# Patient Record
Sex: Female | Born: 1985 | Race: White | Hispanic: No | Marital: Married | Smoking: Former smoker
Health system: Southern US, Community
[De-identification: ages and names within clinical notes are randomized; demographics above are authoritative.]

---

## 2015-05-12 ENCOUNTER — Emergency Department (HOSPITAL_COMMUNITY)
Admission: EM | Admit: 2015-05-12 | Discharge: 2015-05-12 | Disposition: A | Discharge status: Home / Self Care | Payer: Self-pay | Attending: Emergency Medicine | Admitting: Emergency Medicine

## 2015-05-12 ENCOUNTER — Encounter (HOSPITAL_COMMUNITY): Payer: Self-pay | Admitting: *Deleted

## 2015-05-12 DIAGNOSIS — Y998 Other external cause status: Secondary | ICD-10-CM | POA: Insufficient documentation

## 2015-05-12 DIAGNOSIS — S61011A Laceration without foreign body of right thumb without damage to nail, initial encounter: Secondary | ICD-10-CM | POA: Insufficient documentation

## 2015-05-12 DIAGNOSIS — Y9389 Activity, other specified: Secondary | ICD-10-CM | POA: Insufficient documentation

## 2015-05-12 DIAGNOSIS — Y929 Unspecified place or not applicable: Secondary | ICD-10-CM | POA: Insufficient documentation

## 2015-05-12 DIAGNOSIS — Z23 Encounter for immunization: Secondary | ICD-10-CM | POA: Insufficient documentation

## 2015-05-12 DIAGNOSIS — Y288XXA Contact with other sharp object, undetermined intent, initial encounter: Secondary | ICD-10-CM | POA: Insufficient documentation

## 2015-05-12 DIAGNOSIS — Z720 Tobacco use: Secondary | ICD-10-CM | POA: Insufficient documentation

## 2015-05-12 MED ORDER — OXYCODONE-ACETAMINOPHEN 5-325 MG PO TABS
1.0000 | ORAL_TABLET | Freq: Once | ORAL | Status: AC
  Administered 2015-05-12: 1 via ORAL
  Filled 2015-05-12: qty 1

## 2015-05-12 MED ORDER — LIDOCAINE HCL (PF) 1 % IJ SOLN
5.0000 mL | Freq: Once | INTRAMUSCULAR | Status: AC
  Administered 2015-05-12: 5 mL
  Filled 2015-05-12: qty 5

## 2015-05-12 MED ORDER — TETANUS-DIPHTH-ACELL PERTUSSIS 5-2.5-18.5 LF-MCG/0.5 IM SUSP
0.5000 mL | Freq: Once | INTRAMUSCULAR | Status: AC
  Administered 2015-05-12: 0.5 mL via INTRAMUSCULAR
  Filled 2015-05-12: qty 0.5

## 2015-05-12 MED ORDER — NAPROXEN 500 MG PO TBEC: 500.0000 mg | DELAYED_RELEASE_TABLET | Freq: Two times a day (BID) | ORAL | Status: DC

## 2015-05-12 MED ORDER — BACITRACIN 500 UNIT/GM EX OINT
1.0000 "application " | TOPICAL_OINTMENT | Freq: Two times a day (BID) | CUTANEOUS | Status: DC
  Filled 2015-05-12: qty 0.9

## 2015-05-12 NOTE — ED Notes (Signed)
Pt to ED c./o finger lac to R thumb. Cut thumb on a can of peaches. Bleeding controlled

## 2015-05-12 NOTE — ED Notes (Signed)
NP Hope at the bedside.  

## 2015-05-12 NOTE — ED Provider Notes (Signed)
CSN: [id number]     Arrival date & time 05/12/15  1728 History  This chart [facility]ese, [facility], working with Arby BarretteMarcy Pfeiffer, MD by Mindy SpannerGarrett Cook, ED Scribe. This patient was seen in room TR10C/TR10C and the patient's care was started at 6:02 PM.  Chief Complaint  Patient presents with  . Finger Injury   The history is provided by the patient. No language interpreter was used.   HPI Comments: Mindy SiasHeather Serrano is a 29 y.o. female who presents to the Emergency Department complaining of a laceration sustained 1 hour ago.  Patient reports she was trying to open a can of peaches and when her can opener did not complete the job, she tried to pry the top off with a fork, causing her complaint.  She has applied pressure and bleeding is controlled.  She is right hand dominant.  History reviewed. No pertinent past medical history. Past Surgical History  Procedure Laterality Date  . Cesarean section     History reviewed. No pertinent family history. History  Substance Use Topics  . Smoking status: Current Some Day Smoker  . Smokeless tobacco: Not on file  . Alcohol Use: Yes   OB History    No data available     Review of Systems  Musculoskeletal:       Laceration right thumb.  Skin: Positive for wound.      Allergies  Review of patient's allergies indicates no known allergies.  Home Medications   Prior to Admission medications   Medication Sig Start Date End Date Taking? Authorizing Provider  naproxen (EC-NAPROSYN) 500 MG EC tablet Take 1 tablet (500 mg total) by mouth 2 (two) times daily with a meal. 05/12/15   Hope Orlene OchM Neese, NP   BP 110/72 mmHg  Pulse 81  Temp(Src) 97.8 F (36.6 C) (Oral)  Resp 12  SpO2 100%  LMP 04/24/2015 Physical Exam  Constitutional: She is oriented to person, place, and time. She appears well-developed and well-nourished. No distress.  HENT:  Head: Normocephalic and atraumatic.  Eyes: Conjunctivae and EOM are normal.  Neck: Neck supple. No  tracheal deviation present.  Cardiovascular: Normal rate.   Pulmonary/Chest: Effort normal. No respiratory distress.  Musculoskeletal: Normal range of motion.       Right hand: She exhibits tenderness and laceration. She exhibits normal range of motion, normal capillary refill, no deformity and no swelling. Normal sensation noted. Normal strength noted.       Hands: Radial pulse 2+, good touch sensation. No tendon defect.  Neurological: She is alert and oriented to person, place, and time.  Skin: Skin is warm and dry.  Psychiatric: She has a normal mood and affect. Her behavior is normal.  Nursing note and vitals reviewed.   ED Course  Procedures (including critical care time)  DIAGNOSTIC STUDIES: Oxygen Saturation is 100% on RA, normal by my interpretation.    COORDINATION OF CARE:  6:04 PM Discussed treatment plan with patient at bedside.  Patient acknowledges and agrees with plan.     LACERATION REPAIR PROCEDURE NOTE The patient's identification was confirmed and consent was obtained. This procedure was performed by Janne NapoleonHope M Neese, NP, at 6:46 PM. Site: right thumb Sterile procedures observed Anesthetic used Lidocaine 1% without epinephrine Suture type/size:5-0 prolene Length:1.5 cm # of Sutures: 3 Technique: interrupted Complexitysimple Antibx ointment applied Tetanus UTD or ordered Site anesthetized, irrigated with NS, explored without evidence of foreign body, wound well approximated, site covered with sterile dressing.  Patient tolerated  procedure well without complications. Instructions for care discussed verbally and patient provided with additional written instructions for homecare and f/u.   MDM  29 y.o. female with laceration of the right thumb stable for d/c without neurovascular deficits. Patient will follow up in 7 days for suture removal or sooner for problems. Discussed with the patient and all questioned fully answered.   Final diagnoses:  Laceration of  right thumb, initial encounter    I personally performed the services described in this documentation, which was scribed in my presence. The recorded information has been reviewed and is accurate.    MorrisonHope M Neese, NP 05/12/15 2239  Arby BarretteMarcy Pfeiffer, MD 05/18/15 [phone]

## 2018-11-12 ENCOUNTER — Other Ambulatory Visit: Payer: Self-pay

## 2018-11-12 ENCOUNTER — Emergency Department (HOSPITAL_COMMUNITY)
Admission: EM | Admit: 2018-11-12 | Discharge: 2018-11-12 | Disposition: A | Discharge status: Home / Self Care | Payer: Self-pay | Attending: Emergency Medicine | Admitting: Emergency Medicine

## 2018-11-12 ENCOUNTER — Emergency Department (HOSPITAL_COMMUNITY): Payer: Self-pay

## 2018-11-12 DIAGNOSIS — Y9389 Activity, other specified: Secondary | ICD-10-CM | POA: Insufficient documentation

## 2018-11-12 DIAGNOSIS — S62639A Displaced fracture of distal phalanx of unspecified finger, initial encounter for closed fracture: Secondary | ICD-10-CM

## 2018-11-12 DIAGNOSIS — Y9289 Other specified places as the place of occurrence of the external cause: Secondary | ICD-10-CM | POA: Insufficient documentation

## 2018-11-12 DIAGNOSIS — F17200 Nicotine dependence, unspecified, uncomplicated: Secondary | ICD-10-CM | POA: Insufficient documentation

## 2018-11-12 DIAGNOSIS — Y999 Unspecified external cause status: Secondary | ICD-10-CM | POA: Insufficient documentation

## 2018-11-12 DIAGNOSIS — W230XXA Caught, crushed, jammed, or pinched between moving objects, initial encounter: Secondary | ICD-10-CM | POA: Insufficient documentation

## 2018-11-12 DIAGNOSIS — S62631A Displaced fracture of distal phalanx of left index finger, initial encounter for closed fracture: Secondary | ICD-10-CM | POA: Insufficient documentation

## 2018-11-12 MED ORDER — HYDROCODONE-ACETAMINOPHEN 5-325 MG PO TABS: 1.0000 | ORAL_TABLET | ORAL | 0 refills | Status: DC | PRN

## 2018-11-12 MED ORDER — IBUPROFEN 800 MG PO TABS
800.0000 mg | ORAL_TABLET | Freq: Once | ORAL | Status: AC
  Administered 2018-11-12: 800 mg via ORAL
  Filled 2018-11-12: qty 1

## 2018-11-12 MED ORDER — IBUPROFEN 800 MG PO TABS: 800.0000 mg | ORAL_TABLET | Freq: Three times a day (TID) | ORAL | 0 refills | Status: DC

## 2018-11-12 NOTE — ED Triage Notes (Signed)
Patient c/o pain to left hand, 2nd and 3rd digits after slamming the car door on her fingers.

## 2018-11-12 NOTE — ED Provider Notes (Signed)
[facility] EMERGENCY DEPARTMENT Provider Note   CSN: [id number] Arrival date & time: 11/12/18  0330     History   Chief Complaint Chief Complaint  Patient presents with  . Hand Pain    HPI Mindy Serrano is a 32 y.o. female.  The history is provided by the patient and medical records.  Hand Pain      32 y.o. F here with left index finger pain after she accidentally slammed it in a car door around midnight (about 3.5 hours ago).  States she has throbbing pain in the affected finger and difficulty with ROM.  Reports some tingling but denies numbness.  She is right hand dominant.  No intervention tried PTA.  No past medical history on file.  There are no active problems to display for this patient.   Past Surgical History:  Procedure Laterality Date  . CESAREAN SECTION       OB History   None      Home Medications    Prior to Admission medications   Medication Sig Start Date End Date Taking? Authorizing Provider  naproxen (EC-NAPROSYN) 500 MG EC tablet Take 1 tablet (500 mg total) by mouth 2 (two) times daily with a meal. 05/12/15   Janne Napoleon, NP    Family History No family history on file.  Social History Social History   Tobacco Use  . Smoking status: Current Some Day Smoker  Substance Use Topics  . Alcohol use: Yes  . Drug use: No     Allergies   Patient has no known allergies.   Review of Systems Review of Systems  Musculoskeletal: Positive for arthralgias.  All other systems reviewed and are negative.    Physical Exam Updated Vital Signs BP (!) 171/125 (BP Location: Right Arm)   Pulse 99   Temp 98 F (36.7 C) (Oral)   Resp 18   Ht 5\' 4"  (1.626 m)   Wt 90.7 kg   SpO2 98%   BMI 34.33 kg/m   Physical Exam  Constitutional: She is oriented to person, place, and time. She appears well-developed and well-nourished.  HENT:  Head: Normocephalic and atraumatic.  Mouth/Throat: Oropharynx is clear and moist.    Eyes: Pupils are equal, round, and reactive to light. Conjunctivae and EOM are normal.  Neck: Normal range of motion.  Cardiovascular: Normal rate, regular rhythm and normal heart sounds.  Pulmonary/Chest: Effort normal and breath sounds normal.  Abdominal: Soft. Bowel sounds are normal.  Musculoskeletal: Normal range of motion.       Left hand: She exhibits tenderness and bony tenderness.       Hands: Left index finger with swelling and bruising over the palmar aspect of the IP joint; there is no open wound or laceration; there does seem to be some abrasions around the cuticle and a small area of subungual hematoma; normal distal sensation, normal cap refill  Neurological: She is alert and oriented to person, place, and time.  Skin: Skin is warm and dry.  Psychiatric: She has a normal mood and affect.  Nursing note and vitals reviewed.    ED Treatments / Results  Labs (all labs ordered are listed, but only abnormal results are displayed) Labs Reviewed - No data to display  EKG None  Radiology Dg Finger Index Left  Result Date: 11/12/2018 CLINICAL DATA:  32 y/o  F; crush injury of the distal index finger. EXAM: LEFT INDEX FINGER 2+V COMPARISON:  None. FINDINGS: Acute comminuted minimally displaced  fracture of the second distal phalanx tuft. No additional fracture or dislocation identified. IMPRESSION: Acute comminuted minimally displaced fracture of the second distal phalanx tuft. Electronically Signed   By: Mitzi HansenLance  Furusawa-Stratton M.D.   On: 11/12/2018 04:08    Procedures Procedures (including critical care time)  Medications Ordered in ED Medications - No data to display   Initial Impression / Assessment and Plan / ED Course  I have reviewed the triage vital signs and the nursing notes.  Pertinent labs & imaging results that were available during my care of the patient were reviewed by me and considered in my medical decision making (see chart for details).  32 y.o. F  here with left index finger pain after slamming it in the car door.  Has some bruising to the finger as well as subungual hematoma.  X-ray with comminuted distal tuft fracture.  Finger remains neurovascularly intact.  Placed in static splint, given hand surgery follow-up.  She can return here for any new/acute changes.  Final Clinical Impressions(s) / ED Diagnoses   Final diagnoses:  Closed fracture of tuft of distal phalanx of finger    ED Discharge Orders         Ordered    HYDROcodone-acetaminophen (NORCO/VICODIN) 5-325 MG tablet  Every 4 hours PRN     11/12/18 0431    ibuprofen (ADVIL,MOTRIN) 800 MG tablet  3 times daily     11/12/18 0431           Garlon HatchetSanders,  M, PA-C 11/12/18 0434    Gilda CreasePollina, Christopher J, MD 11/12/18 [phone]

## 2018-11-12 NOTE — Discharge Instructions (Addendum)
Take the prescribed medication as directed. Follow-up with Dr. Amanda PeaGramig-- call for appt. Return to the ED for new or worsening symptoms.

## 2019-07-23 IMAGING — DX DG FINGER INDEX 2+V*L*
1 series · 3 of 3 positions shown · non-contrast
Comparison: None.

CLINICAL DATA: 32 y/o  F; crush injury of the distal index finger.

EXAM:
LEFT INDEX FINGER 2+V

[Series 1: finger · 0.14mm/px · 3 of 3 slices shown]
[im 1/3]
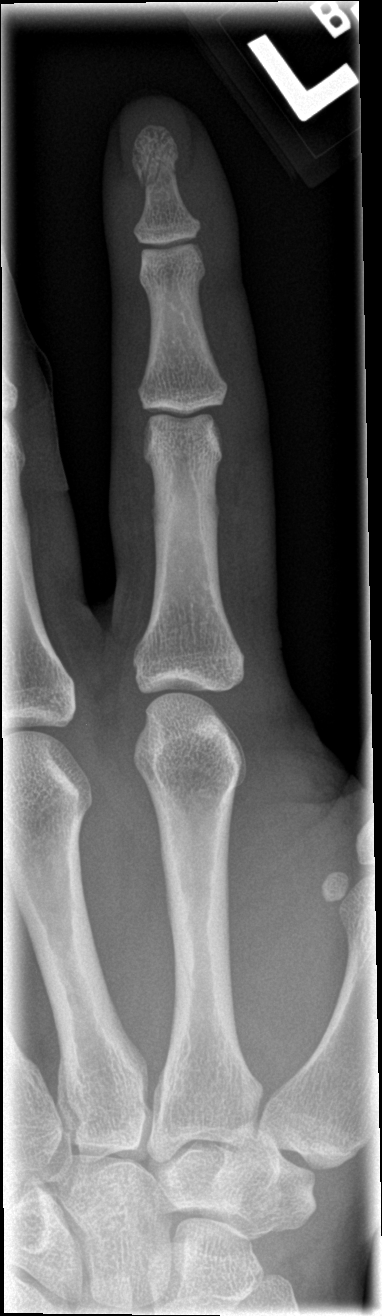
[im 2/3]
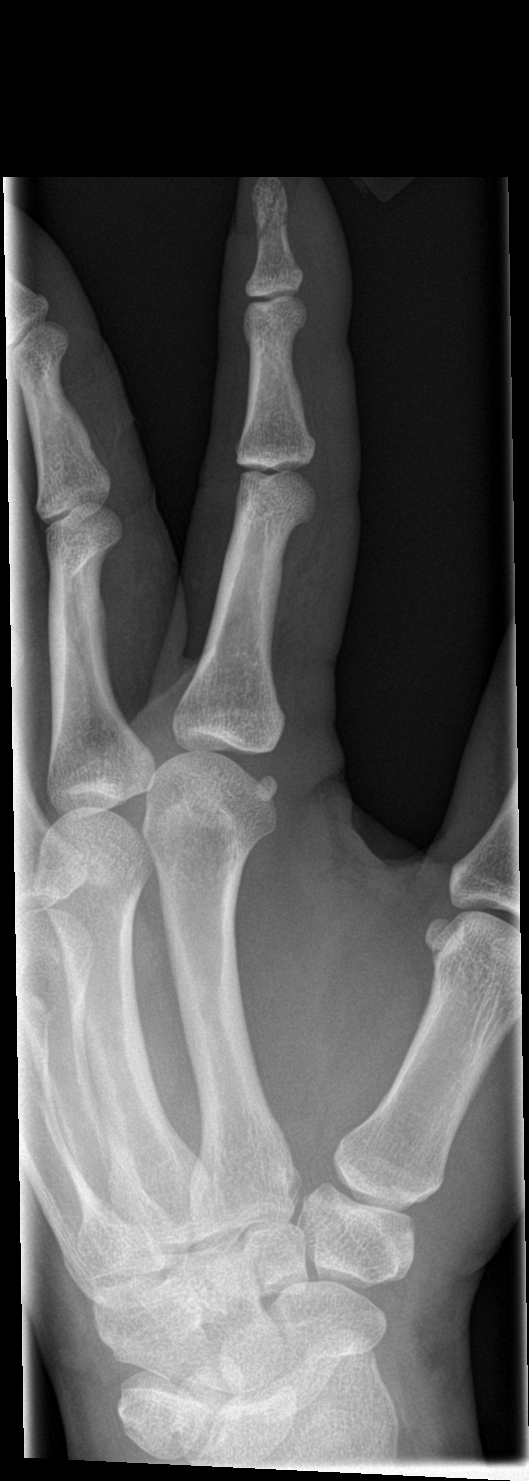
[im 3/3]
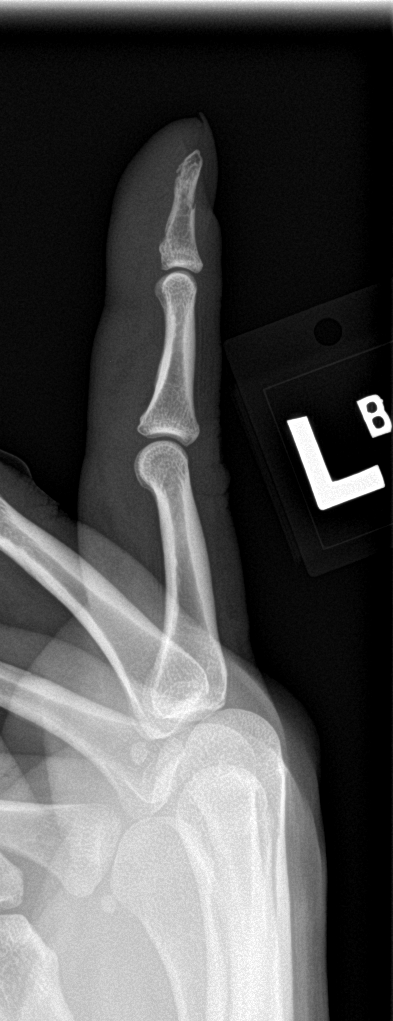

[3 of 3 positions shown; findings below may reference images not displayed]

FINDINGS: Acute comminuted minimally displaced fracture of the second distal
phalanx tuft. No additional fracture or dislocation identified.
IMPRESSION: Acute comminuted minimally displaced fracture of the second distal
phalanx tuft.

## 2021-04-09 ENCOUNTER — Other Ambulatory Visit: Payer: Self-pay

## 2021-04-09 ENCOUNTER — Encounter (HOSPITAL_COMMUNITY): Payer: Self-pay | Admitting: Emergency Medicine

## 2021-04-09 ENCOUNTER — Ambulatory Visit (HOSPITAL_COMMUNITY)
Admission: EM | Admit: 2021-04-09 | Discharge: 2021-04-09 | Disposition: A | Discharge status: Home / Self Care | Payer: Self-pay | Attending: Urgent Care | Admitting: Urgent Care

## 2021-04-09 DIAGNOSIS — J3489 Other specified disorders of nose and nasal sinuses: Secondary | ICD-10-CM | POA: Insufficient documentation

## 2021-04-09 DIAGNOSIS — J069 Acute upper respiratory infection, unspecified: Secondary | ICD-10-CM

## 2021-04-09 DIAGNOSIS — J029 Acute pharyngitis, unspecified: Secondary | ICD-10-CM

## 2021-04-09 DIAGNOSIS — F17200 Nicotine dependence, unspecified, uncomplicated: Secondary | ICD-10-CM | POA: Insufficient documentation

## 2021-04-09 DIAGNOSIS — R0602 Shortness of breath: Secondary | ICD-10-CM | POA: Insufficient documentation

## 2021-04-09 LAB — POCT RAPID STREP A, ED / UC: Streptococcus, Group A Screen (Direct): NEGATIVE

## 2021-04-09 MED ORDER — CETIRIZINE HCL 10 MG PO TABS: 10.0000 mg | ORAL_TABLET | Freq: Every day | ORAL | 0 refills | Status: DC

## 2021-04-09 MED ORDER — PSEUDOEPHEDRINE HCL 60 MG PO TABS: 60.0000 mg | ORAL_TABLET | Freq: Three times a day (TID) | ORAL | 0 refills | Status: DC | PRN

## 2021-04-09 MED ORDER — NAPROXEN 500 MG PO TABS: 500.0000 mg | ORAL_TABLET | Freq: Two times a day (BID) | ORAL | 0 refills | Status: DC

## 2021-04-09 MED ORDER — CHLORASEPTIC MAX SORE THROAT 1.5-33 % MT LIQD: 2.0000 | Freq: Two times a day (BID) | OROMUCOSAL | 0 refills | Status: DC | PRN

## 2021-04-09 NOTE — ED Provider Notes (Signed)
[facility]   MRN: [id number] DOB: December 16, 1986  Subjective:   Mindy Serrano is a 35 y.o. female presenting for acute onset this morning of throat pain. Has also had painful swallowing. Tried benadryl with minimal relief. Has also had stuffy and runny nose. Denies chest pain but does have persistent intermittent shob which is mostly unchanged today. Smokes 1/2ppd.   No current facility-administered medications for this encounter.  Current Outpatient Medications:  .  HYDROcodone-acetaminophen (NORCO/VICODIN) 5-325 MG tablet, Take 1 tablet by mouth every 4 (four) hours as needed., Disp: 12 tablet, Rfl: 0 .  ibuprofen (ADVIL,MOTRIN) 800 MG tablet, Take 1 tablet (800 mg total) by mouth 3 (three) times daily., Disp: 21 tablet, Rfl: 0 .  naproxen (EC-NAPROSYN) 500 MG EC tablet, Take 1 tablet (500 mg total) by mouth 2 (two) times daily with a meal., Disp: 15 tablet, Rfl: 0   Allergies  Allergen Reactions  . Mustard Seed Swelling    History reviewed. No pertinent past medical history.   Past Surgical History:  Procedure Laterality Date  . CESAREAN SECTION      No family history on file.  Social History   Tobacco Use  . Smoking status: Current Every Day Smoker    Packs/day: 0.50  . Smokeless tobacco: Never Used  Substance Use Topics  . Alcohol use: Yes  . Drug use: No    ROS   Objective:   Vitals: BP (!) 135/93   Pulse 89   Temp 98.7 F (37.1 C) (Oral)   Resp 16   LMP 04/02/2021   SpO2 98%   Physical Exam Constitutional:      General: She is not in acute distress.    Appearance: Normal appearance. She is well-developed. She is not ill-appearing, toxic-appearing or diaphoretic.  HENT:     Head: Normocephalic and atraumatic.     Right Ear: External ear normal.     Left Ear: External ear normal.     Nose: Nose normal.     Mouth/Throat:     Mouth: Mucous membranes are moist.     Pharynx: Oropharynx is clear. Posterior oropharyngeal  erythema present. No pharyngeal swelling, oropharyngeal exudate or uvula swelling.     Tonsils: No tonsillar exudate or tonsillar abscesses.  Eyes:     General: No scleral icterus.       Right eye: No discharge.        Left eye: No discharge.     Extraocular Movements: Extraocular movements intact.     Conjunctiva/sclera: Conjunctivae normal.     Pupils: Pupils are equal, round, and reactive to light.  Cardiovascular:     Rate and Rhythm: Normal rate.  Pulmonary:     Effort: Pulmonary effort is normal. No respiratory distress.     Breath sounds: No stridor. No wheezing, rhonchi or rales.  Chest:     Chest wall: No tenderness.  Skin:    General: Skin is warm and dry.  Neurological:     General: No focal deficit present.     Mental Status: She is alert and oriented to person, place, and time.  Psychiatric:        Mood and Affect: Mood normal.        Behavior: Behavior normal.        Thought Content: Thought content normal.        Judgment: Judgment normal.    Negative rapid strep confirmed by PA-Emmabelle Fear.   Assessment and Plan :   PDMP not  reviewed this encounter.  1. Viral upper respiratory tract infection   2. Sore throat   3. Stuffy and runny nose   4. Smoker   5. Shortness of breath     Strep culture pending.  Suspect viral URI, viral syndrome, viral pharyngitis; physical exam findings reassuring and vital signs stable for discharge. Advised supportive care, offered symptomatic relief.  Clear pulmonary exam, will defer chest x-ray.  Patient refused COVID-19 test.  Counseled patient on potential for adverse effects with medications prescribed/recommended today, ER and return-to-clinic precautions discussed, patient verbalized understanding.     Wallis Bamberg, PA-C 04/09/21 1344

## 2021-04-09 NOTE — ED Triage Notes (Signed)
Woke up this morning with a sore throat. Back of throat is red. States breathing feels a bit tight, but this has not worsened since wakening.

## 2021-04-11 LAB — CULTURE, GROUP A STREP (THRC)

## 2022-01-25 ENCOUNTER — Ambulatory Visit
Admission: EM | Admit: 2022-01-25 | Discharge: 2022-01-25 | Disposition: A | Discharge status: Home / Self Care | Payer: 59 | Attending: Internal Medicine | Admitting: Internal Medicine

## 2022-01-25 ENCOUNTER — Other Ambulatory Visit: Payer: Self-pay

## 2022-01-25 ENCOUNTER — Ambulatory Visit (INDEPENDENT_AMBULATORY_CARE_PROVIDER_SITE_OTHER): Payer: 59

## 2022-01-25 ENCOUNTER — Encounter: Payer: Self-pay | Admitting: Emergency Medicine

## 2022-01-25 DIAGNOSIS — S92301A Fracture of unspecified metatarsal bone(s), right foot, initial encounter for closed fracture: Secondary | ICD-10-CM | POA: Diagnosis not present

## 2022-01-25 DIAGNOSIS — S92354A Nondisplaced fracture of fifth metatarsal bone, right foot, initial encounter for closed fracture: Secondary | ICD-10-CM

## 2022-01-25 NOTE — ED Triage Notes (Signed)
Broke right foot in April, states shes had problems with it since then. Was trying to take her sock off the same foot last night when she felt a popping sensation to the lateral fifth metatarsal with pain following. States she feels like there's a knot there now. 3/10 pain, mild swelling

## 2022-01-25 NOTE — Discharge Instructions (Signed)
You have an avulsion fracture of your right foot.  A boot has been applied in urgent care.  Crutches have also been supplied.  Please do not bear any weight until otherwise advised by orthopedist.  Follow-up with orthopedist for further evaluation and management in the next few days.  You may apply ice application as well.

## 2022-01-25 NOTE — ED Provider Notes (Signed)
[facility]    CSN: [place]:[id number] Arrival date & time: 01/25/22  1848      History   Chief Complaint Chief Complaint  Patient presents with   Foot Pain    HPI Mindy Serrano is a 36 y.o. female.   Patient presents with right foot pain that started last night.  She reports that she tried to take her sock off of the opposite foot with the right foot when she felt a popping sensation in her right foot on the right lateral side.  Has been having associated pain ever since.  Denies any numbness or tingling.  Pain occurs with bearing weight.  Patient reports that she broke her foot in April 2022 in the same place.  Has been having intermittent pain ever since but this pain is worse.   Foot Pain   History reviewed. No pertinent past medical history.  There are no problems to display for this patient.   Past Surgical History:  Procedure Laterality Date   CESAREAN SECTION      OB History   No obstetric history on file.      Home Medications    Prior to Admission medications   Medication Sig Start Date End Date Taking? Authorizing Provider  cetirizine (ZYRTEC ALLERGY) 10 MG tablet Take 1 tablet (10 mg total) by mouth daily. 04/09/21   Jaynee Eagles, PA-C  HYDROcodone-acetaminophen (NORCO/VICODIN) 5-325 MG tablet Take 1 tablet by mouth every 4 (four) hours as needed. 11/12/18   Larene Pickett, PA-C  ibuprofen (ADVIL,MOTRIN) 800 MG tablet Take 1 tablet (800 mg total) by mouth 3 (three) times daily. 11/12/18   Larene Pickett, PA-C  naproxen (NAPROSYN) 500 MG tablet Take 1 tablet (500 mg total) by mouth 2 (two) times daily with a meal. 04/09/21   Jaynee Eagles, PA-C  Phenol-Glycerin (CHLORASEPTIC MAX SORE THROAT) 1.5-33 % LIQD Use as directed 2 sprays in the mouth or throat 2 (two) times daily as needed. 04/09/21   Jaynee Eagles, PA-C  pseudoephedrine (SUDAFED) 60 MG tablet Take 1 tablet (60 mg total) by mouth every 8 (eight) hours as needed for congestion. 04/09/21    Jaynee Eagles, PA-C    Family History History reviewed. No pertinent family history.  Social History Social History   Tobacco Use   Smoking status: Every Day    Packs/day: 0.50    Types: Cigarettes   Smokeless tobacco: Never  Substance Use Topics   Alcohol use: Yes   Drug use: No     Allergies   Mustard seed   Review of Systems Review of Systems Per HPI  Physical Exam Triage Vital Signs ED Triage Vitals [01/25/22 1906]  Enc Vitals Group     BP (!) 165/97     Pulse Rate 92     Resp 16     Temp 98.2 F (36.8 C)     Temp Source Oral     SpO2 98 %     Weight      Height      Head Circumference      Peak Flow      Pain Score 3     Pain Loc      Pain Edu?      Excl. in [place]?    No data found.  Updated Vital Signs BP (!) 165/97 (BP Location: Right Arm)    Pulse 92    Temp 98.2 F (36.8 C) (Oral)    Resp 16    SpO2 98%  Visual Acuity Right Eye Distance:   Left Eye Distance:   Bilateral Distance:    Right Eye Near:   Left Eye Near:    Bilateral Near:     Physical Exam Constitutional:      General: She is not in acute distress.    Appearance: Normal appearance. She is not toxic-appearing or diaphoretic.  HENT:     Head: Normocephalic and atraumatic.  Eyes:     Extraocular Movements: Extraocular movements intact.     Conjunctiva/sclera: Conjunctivae normal.  Pulmonary:     Effort: Pulmonary effort is normal.  Musculoskeletal:     Comments: Tenderness to palpation with associated mild swelling noted to base of right fifth metatarsal.  No erythema, bruising, lacerations, abrasions noted.  Neurovascular intact.  Patient has full range of motion of toes and ankle.  Neurological:     General: No focal deficit present.     Mental Status: She is alert and oriented to person, place, and time. Mental status is at baseline.  Psychiatric:        Mood and Affect: Mood normal.        Behavior: Behavior normal.        Thought Content: Thought content normal.         Judgment: Judgment normal.     UC Treatments / Results  Labs (all labs ordered are listed, but only abnormal results are displayed) Labs Reviewed - No data to display  EKG   Radiology DG Foot Complete Right  Result Date: 01/25/2022 CLINICAL DATA:  Foot pain EXAM: RIGHT FOOT COMPLETE - 3+ VIEW COMPARISON:  None. FINDINGS: There is a nondisplaced base of fifth metatarsal avulsion fracture. Mild lateral soft tissue swelling. Plantar calcaneal spur. IMPRESSION: Nondisplaced base of fifth metatarsal avulsion fracture. Electronically Signed   By: Caprice Renshaw M.D.   On: 01/25/2022 19:21    Procedures Procedures (including critical care time)  Medications Ordered in UC Medications - No data to display  Initial Impression / Assessment and Plan / UC Course  I have reviewed the triage vital signs and the nursing notes.  Pertinent labs & imaging results that were available during my care of the patient were reviewed by me and considered in my medical decision making (see chart for details).     Right foot x-ray does show avulsion fracture at base of right fifth metatarsal.  Do not have imaging of previous fracture in April 2022 to compare.  This does appear to be a new fracture.  Will immobilize with a cam boot.  Crutches supplied for patient.  Advised patient of nonweightbearing until otherwise advised by orthopedist.  Provided patient with contact information for orthopedist for follow-up in the next few days.  Discussed return precautions.  Discussed supportive care and ice application.  Patient verbalized understanding and was agreeable with plan. Final Clinical Impressions(s) / UC Diagnoses   Final diagnoses:  Closed avulsion fracture of metatarsal bone of right foot, initial encounter     Discharge Instructions      You have an avulsion fracture of your right foot.  A boot has been applied in urgent care.  Crutches have also been supplied.  Please do not bear any weight until  otherwise advised by orthopedist.  Follow-up with orthopedist for further evaluation and management in the next few days.  You may apply ice application as well.     ED Prescriptions   None    PDMP not reviewed this encounter.   [address] [place],  FNP 01/25/22 1941

## 2022-01-29 ENCOUNTER — Other Ambulatory Visit: Payer: Self-pay

## 2022-01-29 ENCOUNTER — Encounter: Payer: Self-pay | Admitting: Physician Assistant

## 2022-01-29 ENCOUNTER — Ambulatory Visit (INDEPENDENT_AMBULATORY_CARE_PROVIDER_SITE_OTHER): Payer: 59 | Admitting: Physician Assistant

## 2022-01-29 DIAGNOSIS — M79672 Pain in left foot: Secondary | ICD-10-CM | POA: Diagnosis not present

## 2022-01-29 NOTE — Progress Notes (Signed)
Office Visit Note   Patient: Mindy Serrano           Date of Birth: [birth year]           MRN: [id number] Visit Date: 01/29/2022              Requested by: No referring provider defined for this encounter. PCP: Patient, No Pcp Per (Inactive)  Chief Complaint  Patient presents with   Right Foot - Fracture      HPI: Patient is a pleasant 36 year old woman with a chief complaint of right lateral foot pain.  She said she broke her foot back in April 2022.  She said she always had some pain in it but it was getting better.  She was simply manipulating her foot to take off her sock on her other foot and she felt a pop in the same area.  After that it became very painful.  She was seen and evaluated in the emergency room is here in follow-up today  Assessment & Plan: Visit Diagnoses: No diagnosis found.  Plan: Avulsion fracture base of the fifth metatarsal nondisplaced with recurrent injury.  Also tenderness over the distal insertion of the peroneal tendon.  We talked about the natural history of this.  I would like for her to go back in her boot if possible.  She will given a prescription for a lateral heel wedge.  She will follow-up in 2 weeks with new x-rays of her foot.  I am hoping she can start physical therapy.  Given this has been going on now for 10 months if she did not have good improvement I would recommend an MRI.  We also discussed vitamin D supplementation  Follow-Up Instructions: No follow-ups on file.   Ortho Exam  Patient is alert, oriented, no adenopathy, well-dressed, normal affect, normal respiratory effort. Examination of her right foot minimal soft tissue swelling no ecchymosis no tenderness across the Lisfranc joint or ankle.  She is not tender along the proximal peroneal tendon but very tender at the insertion on the base of the fifth metatarsal.  Pain is exacerbated with inversion and eversion.  Dorsiflexion and plantarflexion seem to be okay though she does  remark that when she presses down on her gas pedal it is painful no redness no skin breakdown  Imaging: No results found. No images are attached to the encounter.  Labs: Lab Results  Component Value Date   REPTSTATUS 04/11/2021 FINAL 04/09/2021   CULT  04/09/2021    NO GROUP A STREP (S.PYOGENES) ISOLATED Performed at [facility], [address], [place], [place] [address]      No results found for: ALBUMIN, PREALBUMIN, CBC  No results found for: MG No results found for: VD25OH  No results found for: PREALBUMIN No flowsheet data found.   There is no height or weight on file to calculate BMI.  Orders:  No orders of the defined types were placed in this encounter.  No orders of the defined types were placed in this encounter.    Procedures: No procedures performed  Clinical Data: No additional findings.  ROS:  All other systems negative, except as noted in the HPI. Review of Systems  Objective: Vital Signs: There were no vitals taken for this visit.  Specialty Comments:  No specialty comments available.  PMFS History: There are no problems to display for this patient.  History reviewed. No pertinent past medical history.  History reviewed. No pertinent family history.  Past Surgical  History:  Procedure Laterality Date   CESAREAN SECTION     Social History   Occupational History   Not on file  Tobacco Use   Smoking status: Every Day    Packs/day: 0.50    Types: Cigarettes   Smokeless tobacco: Never  Substance and Sexual Activity   Alcohol use: Yes   Drug use: No   Sexual activity: Not on file

## 2022-02-12 ENCOUNTER — Other Ambulatory Visit: Payer: Self-pay

## 2022-02-12 ENCOUNTER — Ambulatory Visit (INDEPENDENT_AMBULATORY_CARE_PROVIDER_SITE_OTHER): Payer: 59 | Admitting: Physician Assistant

## 2022-02-12 ENCOUNTER — Encounter: Payer: Self-pay | Admitting: Physician Assistant

## 2022-02-12 ENCOUNTER — Ambulatory Visit: Payer: Self-pay

## 2022-02-12 DIAGNOSIS — M25571 Pain in right ankle and joints of right foot: Secondary | ICD-10-CM | POA: Diagnosis not present

## 2022-02-12 NOTE — Progress Notes (Signed)
Office Visit Note   Patient: Mindy Serrano           Date of Birth: 08-May-1986           MRN: [id number] Visit Date: 02/12/2022              Requested by: No referring provider defined for this encounter. PCP: Patient, No Pcp Per (Inactive)  Chief Complaint  Patient presents with   Right Foot - Follow-up      HPI: Patient is a pleasant 36 year old woman who is in follow-up today from a recurrent avulsion injury to the base of her fifth metatarsal on the right.  She said this happened most recently when she was pulling her sock off.  She originally injured this 10 months ago.  When she was trying to take her sock off with her foot she heard a loud pop and the pain returned.  X-rays demonstrated possible avulsion fracture base of fifth metatarsal.  She is not wearing her boot today says she feels better and would like to go back to work  Assessment & Plan: Visit Diagnoses:  1. 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  Talked about the natural history of this.  She wants to go back to work.  I told her at any time if she wanted given how long this has been going on we could go forward with an MRI.  She does have good eversion strength today.  She is still tender over the base of the fifth metatarsal  Follow-Up Instructions: No follow-ups on file.   Ortho Exam  Patient is alert, oriented, no adenopathy, well-dressed, normal affect, normal respiratory effort. Examination demonstrates no redness no swelling.  She does have tenderness to palpation over the base of the fifth metatarsal.  She does not have much pain with resisted eversion inversion and plantarflexion distal CMS is intact  Imaging: No results found. No images are attached to the encounter.  Labs: Lab Results  Component Value Date   REPTSTATUS 04/11/2021 FINAL 04/09/2021   CULT  04/09/2021    NO GROUP A STREP (S.PYOGENES) ISOLATED Performed at  [facility], [address] [place], [place] [address]      No results found for: ALBUMIN, PREALBUMIN, CBC  No results found for: MG No results found for: VD25OH  No results found for: PREALBUMIN No flowsheet data found.   There is no height or weight on file to calculate BMI.  Orders:  Orders Placed This Encounter  Procedures   XR Foot Complete Right   No orders of the defined types were placed in this encounter.    Procedures: No procedures performed  Clinical Data: No additional findings.  ROS:  All other systems negative, except as noted in the HPI. Review of Systems  Objective: Vital Signs: There were no vitals taken for this visit.  Specialty Comments:  No specialty comments available.  PMFS History: There are no problems to display for this patient.  History reviewed. No pertinent past medical history.  History reviewed. No pertinent family history.  Past Surgical History:  Procedure Laterality Date   CESAREAN SECTION     Social History   Occupational History   Not on file  Tobacco Use   Smoking status: Every Day    Packs/day: 0.50    Types: Cigarettes   Smokeless tobacco: Never  Substance and Sexual Activity   Alcohol use:  Yes   Drug use: No   Sexual activity: Not on file

## 2022-02-17 ENCOUNTER — Encounter: Payer: Self-pay | Admitting: Emergency Medicine

## 2022-02-17 ENCOUNTER — Ambulatory Visit
Admission: EM | Admit: 2022-02-17 | Discharge: 2022-02-17 | Disposition: A | Discharge status: Home / Self Care | Payer: 59 | Attending: Internal Medicine | Admitting: Internal Medicine

## 2022-02-17 DIAGNOSIS — J020 Streptococcal pharyngitis: Secondary | ICD-10-CM

## 2022-02-17 LAB — POCT RAPID STREP A (OFFICE): Rapid Strep A Screen: POSITIVE — AB

## 2022-02-17 MED ORDER — AMOXICILLIN-POT CLAVULANATE 875-125 MG PO TABS: 1.0000 | ORAL_TABLET | Freq: Two times a day (BID) | ORAL | 0 refills | Status: DC

## 2022-02-17 NOTE — Discharge Instructions (Signed)
You have strep throat which is being treated with an antibiotic.  Please follow-up if symptoms persist or worsen. ?

## 2022-02-17 NOTE — ED Provider Notes (Signed)
[facility]    CSN: [id number] Arrival date & time: 02/17/22  1433      History   Chief Complaint Chief Complaint  Patient presents with   Sore Throat    HPI Mindy Serrano is a 36 y.o. female.   Patient presents with sore throat and mild nonproductive cough that has been present for 1 day.  Denies any associated upper respiratory symptoms or fever.  Denies any known sick contacts.  Patient has not taken any medications to alleviate symptoms.  Denies chest pain, shortness of breath, ear pain, nausea, vomiting, diarrhea, abdominal pain.   Sore Throat   History reviewed. No pertinent past medical history.  There are no problems to display for this patient.   Past Surgical History:  Procedure Laterality Date   CESAREAN SECTION      OB History   No obstetric history on file.      Home Medications    Prior to Admission medications   Medication Sig Start Date End Date Taking? Authorizing Provider  amoxicillin-clavulanate (AUGMENTIN) 875-125 MG tablet Take 1 tablet by mouth every 12 (twelve) hours. 02/17/22  Yes Khamil Lamica, Acie Fredrickson, FNP  cetirizine (ZYRTEC ALLERGY) 10 MG tablet Take 1 tablet (10 mg total) by mouth daily. 04/09/21   Wallis Bamberg, PA-C  HYDROcodone-acetaminophen (NORCO/VICODIN) 5-325 MG tablet Take 1 tablet by mouth every 4 (four) hours as needed. 11/12/18   Garlon Hatchet, PA-C  ibuprofen (ADVIL,MOTRIN) 800 MG tablet Take 1 tablet (800 mg total) by mouth 3 (three) times daily. 11/12/18   Garlon Hatchet, PA-C  naproxen (NAPROSYN) 500 MG tablet Take 1 tablet (500 mg total) by mouth 2 (two) times daily with a meal. 04/09/21   Wallis Bamberg, PA-C  Phenol-Glycerin (CHLORASEPTIC MAX SORE THROAT) 1.5-33 % LIQD Use as directed 2 sprays in the mouth or throat 2 (two) times daily as needed. 04/09/21   Wallis Bamberg, PA-C  pseudoephedrine (SUDAFED) 60 MG tablet Take 1 tablet (60 mg total) by mouth every 8 (eight) hours as needed for congestion. 04/09/21   Wallis Bamberg,  PA-C    Family History Family History  Problem Relation Age of Onset   Healthy Mother     Social History Social History   Tobacco Use   Smoking status: Every Day    Packs/day: 0.50    Types: Cigarettes   Smokeless tobacco: Never  Substance Use Topics   Alcohol use: Yes   Drug use: No     Allergies   Mustard seed   Review of Systems Review of Systems Per HPI  Physical Exam Triage Vital Signs ED Triage Vitals [02/17/22 1451]  Enc Vitals Group     BP (!) 149/111     Pulse Rate 90     Resp 18     Temp 98.4 F (36.9 C)     Temp Source Oral     SpO2 94 %     Weight 200 lb (90.7 kg)     Height 5\' 4"  (1.626 m)     Head Circumference      Peak Flow      Pain Score 6     Pain Loc      Pain Edu?      Excl. in GC?    No data found.  Updated Vital Signs BP (!) 149/111 (BP Location: Left Arm)    Pulse 90    Temp 98.4 F (36.9 C) (Oral)    Resp 18    Ht 5'  4" (1.626 m)    Wt 200 lb (90.7 kg)    LMP 01/24/2022    SpO2 94%    BMI 34.33 kg/m   Visual Acuity Right Eye Distance:   Left Eye Distance:   Bilateral Distance:    Right Eye Near:   Left Eye Near:    Bilateral Near:     Physical Exam Constitutional:      General: She is not in acute distress.    Appearance: Normal appearance. She is not toxic-appearing or diaphoretic.  HENT:     Head: Normocephalic and atraumatic.     Right Ear: Tympanic membrane and ear canal normal.     Left Ear: Tympanic membrane and ear canal normal.     Nose: Congestion present.     Mouth/Throat:     Mouth: Mucous membranes are moist.     Pharynx: Posterior oropharyngeal erythema present. No oropharyngeal exudate.     Tonsils: No tonsillar exudate or tonsillar abscesses. 1+ on the right. 1+ on the left.  Eyes:     Extraocular Movements: Extraocular movements intact.     Conjunctiva/sclera: Conjunctivae normal.     Pupils: Pupils are equal, round, and reactive to light.  Cardiovascular:     Rate and Rhythm: Normal rate and  regular rhythm.     Pulses: Normal pulses.     Heart sounds: Normal heart sounds.  Pulmonary:     Effort: Pulmonary effort is normal. No respiratory distress.     Breath sounds: Normal breath sounds. No wheezing.  Abdominal:     General: Abdomen is flat. Bowel sounds are normal.     Palpations: Abdomen is soft.  Musculoskeletal:        General: Normal range of motion.     Cervical back: Normal range of motion.  Skin:    General: Skin is warm and dry.  Neurological:     General: No focal deficit present.     Mental Status: She is alert and oriented to person, place, and time. Mental status is at baseline.  Psychiatric:        Mood and Affect: Mood normal.        Behavior: Behavior normal.     UC Treatments / Results  Labs (all labs ordered are listed, but only abnormal results are displayed) Labs Reviewed  POCT RAPID STREP A (OFFICE) - Abnormal; Notable for the following components:      Result Value   Rapid Strep A Screen Positive (*)    All other components within normal limits    EKG   Radiology No results found.  Procedures Procedures (including critical care time)  Medications Ordered in UC Medications - No data to display  Initial Impression / Assessment and Plan / UC Course  I have reviewed the triage vital signs and the nursing notes.  Pertinent labs & imaging results that were available during my care of the patient were reviewed by me and considered in my medical decision making (see chart for details).     Rapid strep test was positive.  Will treat with Augmentin antibiotic given that patient has had recent antibiotic therapy for skin infection.  No signs of peritonsillar abscess on exam.  Discussed supportive care and symptom management with patient.  Discussed return precautions.  Patient verbalized understanding and was agreeable with plan. Final Clinical Impressions(s) / UC Diagnoses   Final diagnoses:  Strep pharyngitis     Discharge  Instructions      You have strep  throat which is being treated with an antibiotic.  Please follow-up if symptoms persist or worsen.    ED Prescriptions     Medication Sig Dispense Auth. Provider   amoxicillin-clavulanate (AUGMENTIN) 875-125 MG tablet Take 1 tablet by mouth every 12 (twelve) hours. 14 tablet [place], Acie Fredrickson, [place]      PDMP not reviewed this encounter.   Gustavus Bryant, [place] 02/17/22 [id number]

## 2022-02-17 NOTE — ED Triage Notes (Signed)
Patient c/o severe sore throat x 1 day, slight cough.  Patient denies any OTC meds.

## 2022-03-14 ENCOUNTER — Ambulatory Visit (HOSPITAL_COMMUNITY)
Admission: EM | Admit: 2022-03-14 | Discharge: 2022-03-14 | Disposition: A | Discharge status: Home / Self Care | Payer: 59 | Attending: Family Medicine | Admitting: Family Medicine

## 2022-03-14 ENCOUNTER — Encounter (HOSPITAL_COMMUNITY): Payer: Self-pay | Admitting: *Deleted

## 2022-03-14 DIAGNOSIS — R220 Localized swelling, mass and lump, head: Secondary | ICD-10-CM

## 2022-03-14 DIAGNOSIS — K029 Dental caries, unspecified: Secondary | ICD-10-CM

## 2022-03-14 DIAGNOSIS — K047 Periapical abscess without sinus: Secondary | ICD-10-CM

## 2022-03-14 MED ORDER — NAPROXEN 500 MG PO TABS: 500.0000 mg | ORAL_TABLET | Freq: Two times a day (BID) | ORAL | 0 refills | Status: DC

## 2022-03-14 MED ORDER — PREDNISONE 20 MG PO TABS: 20.0000 mg | ORAL_TABLET | Freq: Every day | ORAL | 0 refills | Status: AC

## 2022-03-14 MED ORDER — CLINDAMYCIN HCL 300 MG PO CAPS: 300.0000 mg | ORAL_CAPSULE | Freq: Three times a day (TID) | ORAL | 0 refills | Status: AC

## 2022-03-14 NOTE — ED Provider Notes (Signed)
?[facility] ? ? ? ?CSN: [id number] ?Arrival date & time: 03/14/22  1912 ? ? ?  ? ?History   ?Chief Complaint ?Chief Complaint  ?Patient presents with  ? Dental Pain  ? ? ?HPI ?Mindy Serrano is a 36 y.o. female.  ? ?HPI ?Patient presents with dental pain and facial swelling.  Facial swelling is localized to the upper right side of her face.  She reports tooth pain started yesterday.  It is localized to the upper right side of her mouth.  Patient endorses poor dentition. Denies fever, nausea, or vomiting. Patient has not recently been evaluated by dentist. ? ?History reviewed. No pertinent past medical history. ? ?There are no problems to display for this patient. ? ? ?Past Surgical History:  ?Procedure Laterality Date  ? CESAREAN SECTION    ? ? ?OB History   ?No obstetric history on file. ?  ? ? ? ?Home Medications   ? ?Prior to Admission medications   ?Medication Sig Start Date End Date Taking? Authorizing Provider  ?amoxicillin-clavulanate (AUGMENTIN) 875-125 MG tablet Take 1 tablet by mouth every 12 (twelve) hours. 02/17/22   Gustavus Bryant, FNP  ?cetirizine (ZYRTEC ALLERGY) 10 MG tablet Take 1 tablet (10 mg total) by mouth daily. 04/09/21   Wallis Bamberg, PA-C  ?HYDROcodone-acetaminophen (NORCO/VICODIN) 5-325 MG tablet Take 1 tablet by mouth every 4 (four) hours as needed. 11/12/18   Garlon Hatchet, PA-C  ?ibuprofen (ADVIL,MOTRIN) 800 MG tablet Take 1 tablet (800 mg total) by mouth 3 (three) times daily. 11/12/18   Garlon Hatchet, PA-C  ?naproxen (NAPROSYN) 500 MG tablet Take 1 tablet (500 mg total) by mouth 2 (two) times daily with a meal. 04/09/21   Wallis Bamberg, PA-C  ?Phenol-Glycerin (CHLORASEPTIC MAX SORE THROAT) 1.5-33 % LIQD Use as directed 2 sprays in the mouth or throat 2 (two) times daily as needed. 04/09/21   Wallis Bamberg, PA-C  ?pseudoephedrine (SUDAFED) 60 MG tablet Take 1 tablet (60 mg total) by mouth every 8 (eight) hours as needed for congestion. 04/09/21   Wallis Bamberg, PA-C  ? ? ?Family  History ?Family History  ?Problem Relation Age of Onset  ? Healthy Mother   ? ? ?Social History ?Social History  ? ?Tobacco Use  ? Smoking status: Every Day  ?  Packs/day: 0.50  ?  Types: Cigarettes  ? Smokeless tobacco: Never  ?Vaping Use  ? Vaping Use: Never used  ?Substance Use Topics  ? Alcohol use: Yes  ?  Comment: occasionally  ? Drug use: Never  ? ? ? ?Allergies   ?Mustard seed ? ? ?Review of Systems ?Review of Systems ?Pertinent negatives listed in HPI  ? ?Physical Exam ?Triage Vital Signs ?ED Triage Vitals [03/14/22 1941]  ?Enc Vitals Group  ?   BP (!) 130/92  ?   Pulse Rate (!) 102  ?   Resp 20  ?   Temp 98.7 ?F (37.1 ?C)  ?   Temp Source Oral  ?   SpO2 98 %  ?   Weight   ?   Height   ?   Head Circumference   ?   Peak Flow   ?   Pain Score 8  ?   Pain Loc   ?   Pain Edu?   ?   Excl. in GC?   ? ?No data found. ? ?Updated Vital Signs ?BP (!) 130/92   Pulse (!) 102   Temp 98.7 ?F (37.1 ?C) (Oral)   Resp  20   LMP 02/21/2022 (Exact Date)   SpO2 98%  ? ?Visual Acuity ?Right Eye Distance:   ?Left Eye Distance:   ?Bilateral Distance:   ? ?Right Eye Near:   ?Left Eye Near:    ?Bilateral Near:    ? ?Physical Exam ?Constitutional:   ?   Appearance: Normal appearance.  ?HENT:  ?   Head: Normocephalic.  ?   Comments: Left-sided jaw facial swelling ?   Mouth/Throat:  ?   Dentition: Abnormal dentition. Dental tenderness, gingival swelling and dental caries present. No dental abscesses.  ?   Pharynx: Oropharynx is clear. Uvula midline.  ?Cardiovascular:  ?   Rate and Rhythm: Normal rate and regular rhythm.  ?Pulmonary:  ?   Effort: Pulmonary effort is normal.  ?   Breath sounds: Normal breath sounds and air entry.  ?Neurological:  ?   Mental Status: She is alert.  ? ?UC Treatments / Results  ?Labs ?(all labs ordered are listed, but only abnormal results are displayed) ?Labs Reviewed - No data to display ? ?EKG ? ? ?Radiology ?No results found. ? ?Procedures ?Procedures (including critical care time) ? ?Medications  Ordered in UC ?Medications - No data to display ? ?Initial Impression / Assessment and Plan / UC Course  ?I have reviewed the triage vital signs and the nursing notes. ? ?Pertinent labs & imaging results that were available during my care of the patient were reviewed by me and considered in my medical decision making (see chart for details). ? ?  ?Infected dental caries with left facial swelling ?Treatment with prednisone, clindamycin and naproxen. ?Recommended follow-up with the dental provider for evaluation of chronic persistent dental caries for  ?evaluation of extraction. Return as needed. ?Final Clinical Impressions(s) / UC Diagnoses  ? ?Final diagnoses:  ?Infected dental caries  ?Left facial swelling  ? ?Discharge Instructions   ?None ?  ? ?ED Prescriptions   ? ? Medication Sig Dispense Auth. Provider  ? predniSONE (DELTASONE) 20 MG tablet Take 1 tablet (20 mg total) by mouth daily with breakfast for 5 days. 5 tablet Bing Neighbors, FNP  ? clindamycin (CLEOCIN) 300 MG capsule Take 1 capsule (300 mg total) by mouth 3 (three) times daily for 7 days. 21 capsule Bing Neighbors, FNP  ? naproxen (NAPROSYN) 500 MG tablet Take 1 tablet (500 mg total) by mouth 2 (two) times daily with a meal. 30 tablet Bing Neighbors, FNP  ? ?  ? ?PDMP not reviewed this encounter. ?  ?Bing Neighbors, FNP ?03/19/22 1709 ? ?

## 2022-03-14 NOTE — ED Triage Notes (Signed)
C/O right upper dental pain intermittently "for a while" with worsening over past few days; pain now constant. No known fevers. ?

## 2022-04-24 ENCOUNTER — Ambulatory Visit (HOSPITAL_COMMUNITY)
Admission: EM | Admit: 2022-04-24 | Discharge: 2022-04-24 | Disposition: A | Discharge status: Home / Self Care | Payer: 59 | Attending: Nurse Practitioner | Admitting: Nurse Practitioner

## 2022-04-24 ENCOUNTER — Other Ambulatory Visit: Payer: Self-pay

## 2022-04-24 ENCOUNTER — Encounter (HOSPITAL_COMMUNITY): Payer: Self-pay | Admitting: Emergency Medicine

## 2022-04-24 DIAGNOSIS — J069 Acute upper respiratory infection, unspecified: Secondary | ICD-10-CM | POA: Diagnosis not present

## 2022-04-24 DIAGNOSIS — R059 Cough, unspecified: Secondary | ICD-10-CM | POA: Diagnosis not present

## 2022-04-24 DIAGNOSIS — Z20822 Contact with and (suspected) exposure to covid-19: Secondary | ICD-10-CM | POA: Diagnosis not present

## 2022-04-24 MED ORDER — BENZONATATE 100 MG PO CAPS: 100.0000 mg | ORAL_CAPSULE | Freq: Three times a day (TID) | ORAL | 0 refills | Status: DC

## 2022-04-24 MED ORDER — PROMETHAZINE-DM 6.25-15 MG/5ML PO SYRP: 5.0000 mL | ORAL_SOLUTION | Freq: Four times a day (QID) | ORAL | 0 refills | Status: DC | PRN

## 2022-04-24 NOTE — Discharge Instructions (Addendum)
Your symptoms and exam findings are most consistent with a viral upper respiratory infection. These usually run their course in about 7-10 days.  If your symptoms last longer than 10 days without improvement, please seek care. If your symptoms, worsen, please go to the Emergency Room.   ? ?We have tested you today for COVID-19.  You will see the results in Mychart and we will call you with positive results.    Please stay home and isolate until you are aware of the results.   ? ?Some things that can make you feel better are: ?- Increased rest ?- Increasing fluid with water/sugar free electrolytes ?- Acetaminophen and ibuprofen as needed for fever/pain.  ?- Salt water gargling, chloraseptic spray and throat lozenges ?- OTC guaifenesin (Mucinex).  ?- Saline sinus flushes or a neti pot.  ?- Humidifying the air. ? ?You can use Tessalon Perles as needed every 8 hours during the day for cough.  Do not take while driving or putting him machinery.  You can use promethazine-dextromethorphan 5 mL at nighttime as needed for cough.  ? ?

## 2022-04-24 NOTE — ED Triage Notes (Signed)
Started feeling bad yesterday.  Reports a chronic cough, and admits to smoking.  Patient has a stuffy nose, puffy eyes ?

## 2022-04-24 NOTE — ED Provider Notes (Signed)
?[facility] ? ? ? ?CSN: [id number] ?Arrival date & time: 04/24/22  1555 ? ? ?  ? ?History   ?Chief Complaint ?Chief Complaint  ?Patient presents with  ? URI  ? ? ?HPI ?Mindy Serrano is a 36 y.o. female.  ? ?Patient presents with cough, sore throat, headache, nasal congestion, runny nose, post nasal drainage, and decreased appetite since yesterday.  Thinks she has some wheezing at nighttime when she lays down.  Denies chest tightness, shortness of breath, wheezing, sneezing, tooth or ear pain, eye redness/itching, and nausea/vomiting/diarrhea.  Has not taken anything for her symptoms.  ? ?She works in a [employer]. ? ?She also reports her blood pressure has been high the last few times it has been checked in health care settings.  She denies shortness of breath, chest pain, chronic headache, blurred vision, or double vision.   ? ? ? ?History reviewed. No pertinent past medical history. ? ?There are no problems to display for this patient. ? ? ?Past Surgical History:  ?Procedure Laterality Date  ? CESAREAN SECTION    ? ? ?OB History   ?No obstetric history on file. ?  ? ? ? ?Home Medications   ? ?Prior to Admission medications   ?Medication Sig Start Date End Date Taking? Authorizing Provider  ?benzonatate (TESSALON) 100 MG capsule Take 1 capsule (100 mg total) by mouth every 8 (eight) hours. Do not take while driving or operating heavy machinery 04/24/22  Yes Eulogio Bear, NP  ?promethazine-dextromethorphan (PROMETHAZINE-DM) 6.25-15 MG/5ML syrup Take 5 mLs by mouth 4 (four) times daily as needed for cough. Do not take while driving or operating heavy machinery 04/24/22  Yes Eulogio Bear, NP  ?ibuprofen (ADVIL,MOTRIN) 800 MG tablet Take 1 tablet (800 mg total) by mouth 3 (three) times daily. 11/12/18   Larene Pickett, PA-C  ? ? ?Family History ?Family History  ?Problem Relation Age of Onset  ? Healthy Mother   ? ? ?Social History ?Social History  ? ?Tobacco Use  ? Smoking status: Every Day  ?   Packs/day: 0.50  ?  Types: Cigarettes  ? Smokeless tobacco: Never  ?Vaping Use  ? Vaping Use: Never used  ?Substance Use Topics  ? Alcohol use: Yes  ?  Comment: occasionally  ? Drug use: Never  ? ? ? ?Allergies   ?Mustard seed ? ? ?Review of Systems ?Review of Systems ?Per HPI ? ?Physical Exam ?Triage Vital Signs ?ED Triage Vitals  ?Enc Vitals Group  ?   BP 04/24/22 1739 (!) 157/102  ?   Pulse Rate 04/24/22 1739 85  ?   Resp 04/24/22 1739 (!) 22  ?   Temp 04/24/22 1739 98.6 ?F (37 ?C)  ?   Temp Source 04/24/22 1739 Oral  ?   SpO2 04/24/22 1739 97 %  ?   Weight --   ?   Height --   ?   Head Circumference --   ?   Peak Flow --   ?   Pain Score 04/24/22 1736 3  ?   Pain Loc --   ?   Pain Edu? --   ?   Excl. in [place]? --   ? ?No data found. ? ?Updated Vital Signs ?BP (!) 157/102 (BP Location: Right Arm)   Pulse 85   Temp 98.6 ?F (37 ?C) (Oral)   Resp (!) 22   LMP 03/24/2022 (Approximate) Comment: has tubal ligation  SpO2 97%  ? ?Visual Acuity ?Right Eye Distance:   ?  Left Eye Distance:   ?Bilateral Distance:   ? ?Right Eye Near:   ?Left Eye Near:    ?Bilateral Near:    ? ?Physical Exam ?Vitals and nursing note reviewed.  ?Constitutional:   ?   General: She is not in acute distress. ?   Appearance: Normal appearance. She is not ill-appearing or toxic-appearing.  ?HENT:  ?   Head: Normocephalic and atraumatic.  ?   Right Ear: Tympanic membrane, ear canal and external ear normal.  ?   Left Ear: Tympanic membrane, ear canal and external ear normal.  ?   Nose: No congestion or rhinorrhea.  ?   Mouth/Throat:  ?   Mouth: Mucous membranes are moist.  ?   Pharynx: Oropharynx is clear. Posterior oropharyngeal erythema present. No oropharyngeal exudate.  ?   Comments: Post nasal drainage ?Eyes:  ?   General: No scleral icterus. ?   Extraocular Movements: Extraocular movements intact.  ?Cardiovascular:  ?   Rate and Rhythm: Normal rate and regular rhythm.  ?Pulmonary:  ?   Effort: Pulmonary effort is normal. No respiratory  distress.  ?   Breath sounds: Normal breath sounds. No wheezing, rhonchi or rales.  ?Abdominal:  ?   General: Abdomen is flat. Bowel sounds are normal. There is no distension.  ?   Palpations: Abdomen is soft.  ?Musculoskeletal:  ?   Cervical back: Normal range of motion and neck supple.  ?Lymphadenopathy:  ?   Cervical: No cervical adenopathy.  ?Skin: ?   General: Skin is warm and dry.  ?   Coloration: Skin is not jaundiced or pale.  ?   Findings: No erythema or rash.  ?Neurological:  ?   Mental Status: She is alert and oriented to person, place, and time.  ?Psychiatric:     ?   Behavior: Behavior is cooperative.  ? ? ? ?UC Treatments / Results  ?Labs ?(all labs ordered are listed, but only abnormal results are displayed) ?Labs Reviewed  ?SARS CORONAVIRUS 2 (TAT 6-24 HRS)  ? ? ?EKG ? ? ?Radiology ?No results found. ? ?Procedures ?Procedures (including critical care time) ? ?Medications Ordered in UC ?Medications - No data to display ? ?Initial Impression / Assessment and Plan / UC Course  ?I have reviewed the triage vital signs and the nursing notes. ? ?Pertinent labs & imaging results that were available during my care of the patient were reviewed by me and considered in my medical decision making (see chart for details). ? ?  ?Reassured patient that symptoms and exam findings are most consistent with a viral upper respiratory infection.  COVID-19 testing obtained.  Continue supportive care. Increase fluid intake with water or electrolyte solution like pedialyte. Encouraged acetaminophen as needed for fever/pain. Encouraged salt water gargling, chloraseptic spray and throat lozenges. Encouraged OTC guaifenesin. Encouraged saline sinus flushes and/or neti with humidified air.  Start tessalon perles for cough and promethazine-dextromethorphan at night time.  Note given for work.  If symptoms persist or worsen despite treatment, seek care. ? ?BP repeated and remained slightly elevated at 143/101.  She is  asymptomatic.  Encouraged follow up with primary care provider - PCP assistance initiated. ? ?Final Clinical Impressions(s) / UC Diagnoses  ? ?Final diagnoses:  ?Viral URI with cough  ? ? ? ?Discharge Instructions   ? ?  ?Your symptoms and exam findings are most consistent with a viral upper respiratory infection. These usually run their course in about 7-10 days.  If your symptoms last longer than  10 days without improvement, please seek care. If your symptoms, worsen, please go to the Emergency Room.   ? ?We have tested you today for COVID-19.  You will see the results in Mychart and we will call you with positive results.    Please stay home and isolate until you are aware of the results.   ? ?Some things that can make you feel better are: ?- Increased rest ?- Increasing fluid with water/sugar free electrolytes ?- Acetaminophen and ibuprofen as needed for fever/pain.  ?- Salt water gargling, chloraseptic spray and throat lozenges ?- OTC guaifenesin (Mucinex).  ?- Saline sinus flushes or a neti pot.  ?- Humidifying the air. ? ?You can use Tessalon Perles as needed every 8 hours during the day for cough.  Do not take while driving or putting him machinery.  You can use promethazine-dextromethorphan 5 mL at nighttime as needed for cough.  ? ? ? ? ? ?ED Prescriptions   ? ? Medication Sig Dispense Auth. Provider  ? benzonatate (TESSALON) 100 MG capsule Take 1 capsule (100 mg total) by mouth every 8 (eight) hours. Do not take while driving or operating heavy machinery 21 capsule Noemi Chapel A, NP  ? promethazine-dextromethorphan (PROMETHAZINE-DM) 6.25-15 MG/5ML syrup Take 5 mLs by mouth 4 (four) times daily as needed for cough. Do not take while driving or operating heavy machinery 118 mL Eulogio Bear, NP  ? ?  ? ?PDMP not reviewed this encounter. ?  ?Eulogio Bear, NP ?04/24/22 1831 ? ?

## 2022-04-25 LAB — SARS CORONAVIRUS 2 (TAT 6-24 HRS): SARS Coronavirus 2: NEGATIVE

## 2022-10-05 IMAGING — DX DG FOOT COMPLETE 3+V*R*
3 series · 3 of 3 positions shown · non-contrast
Comparison: None.

CLINICAL DATA: Foot pain

EXAM:
RIGHT FOOT COMPLETE - 3+ VIEW

[foot supine dp]
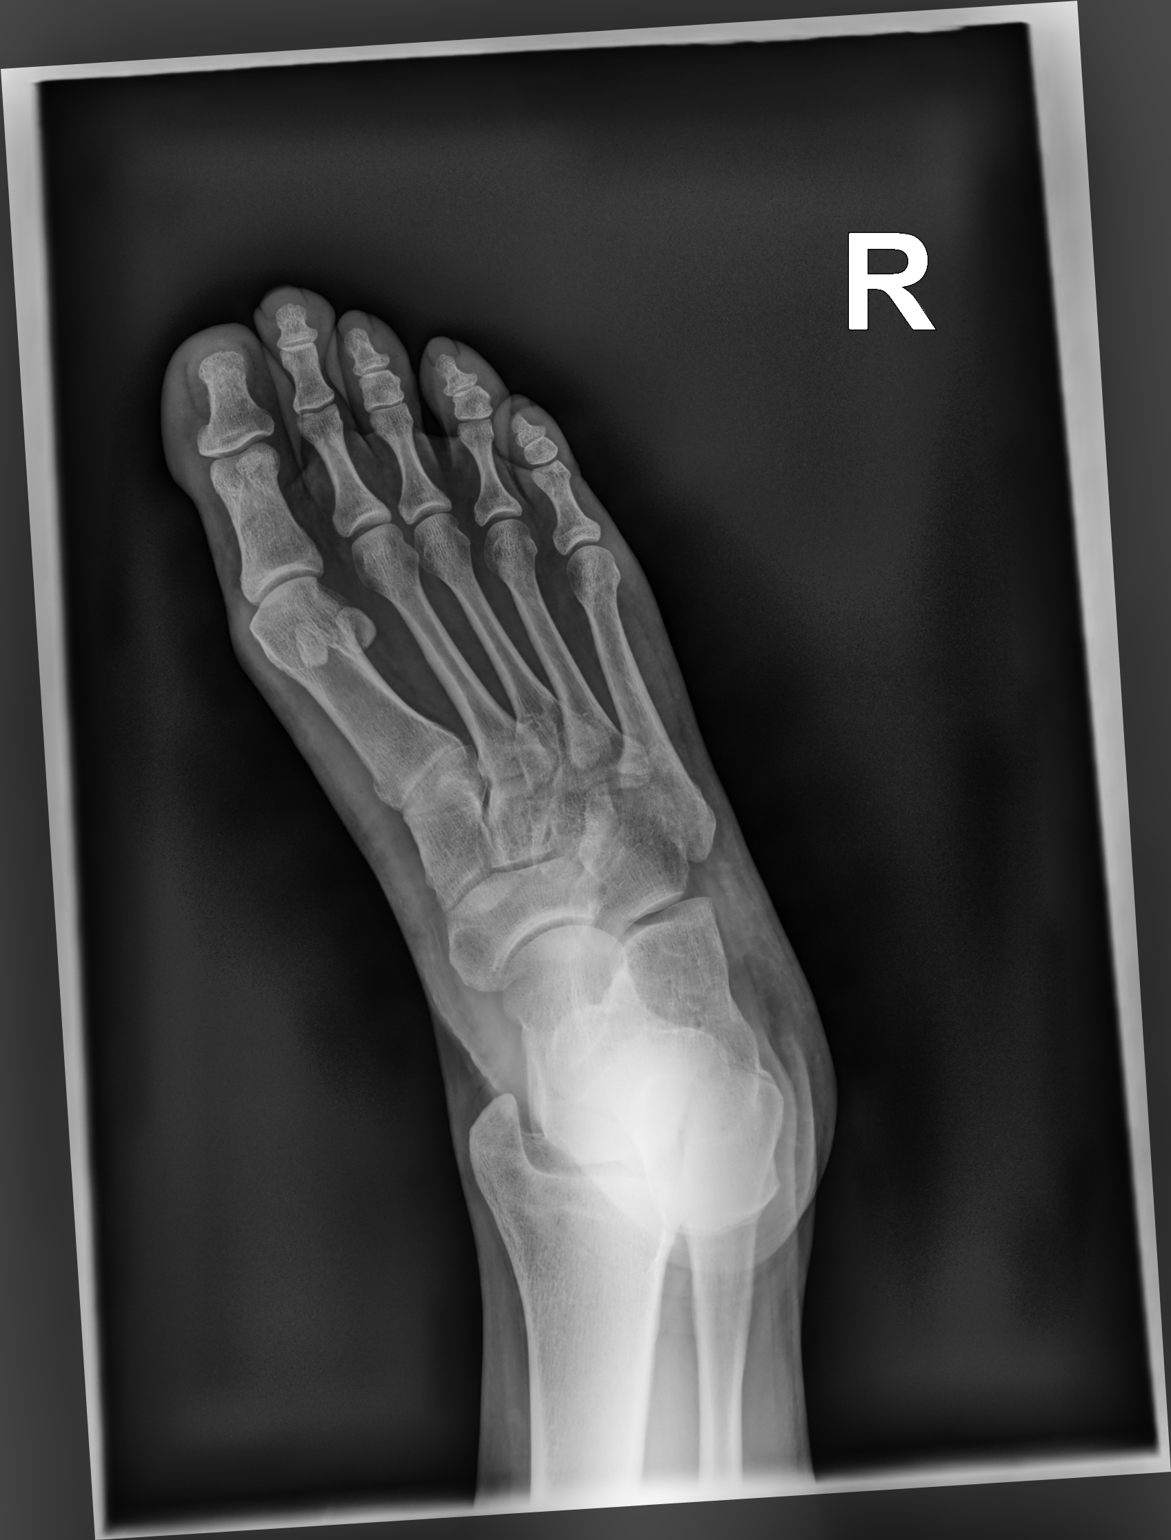

[foot medial oblique]
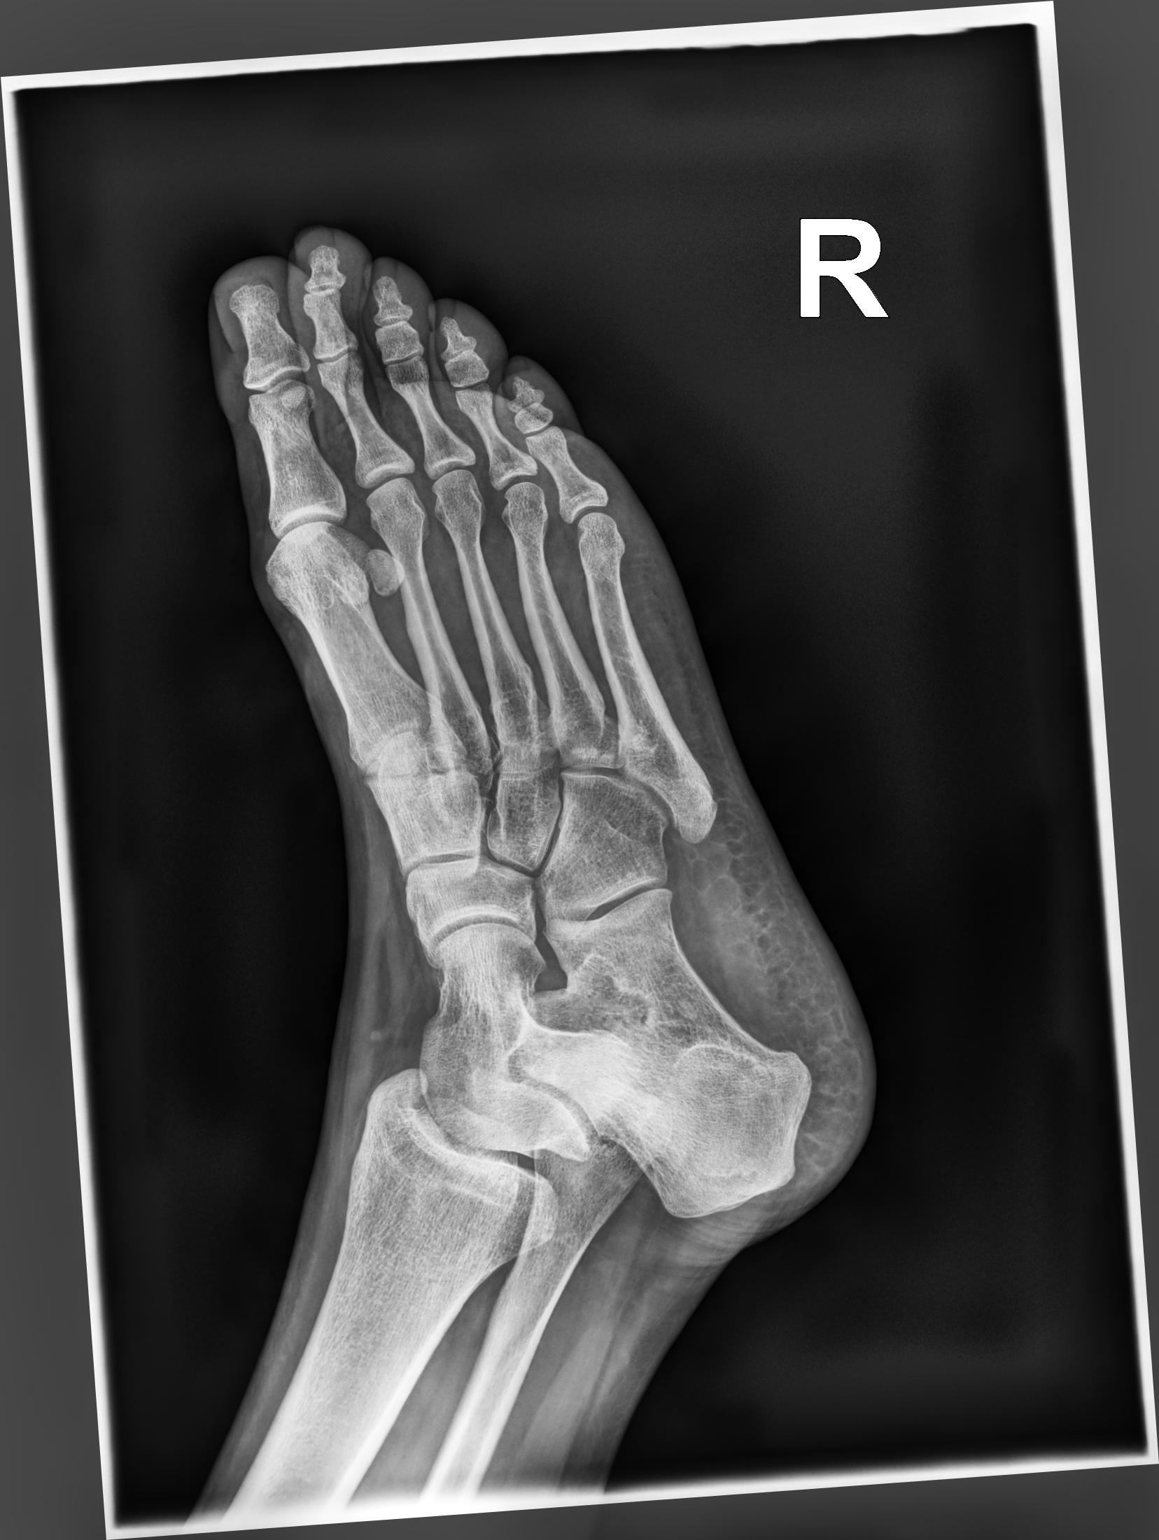

[foot supine lat]
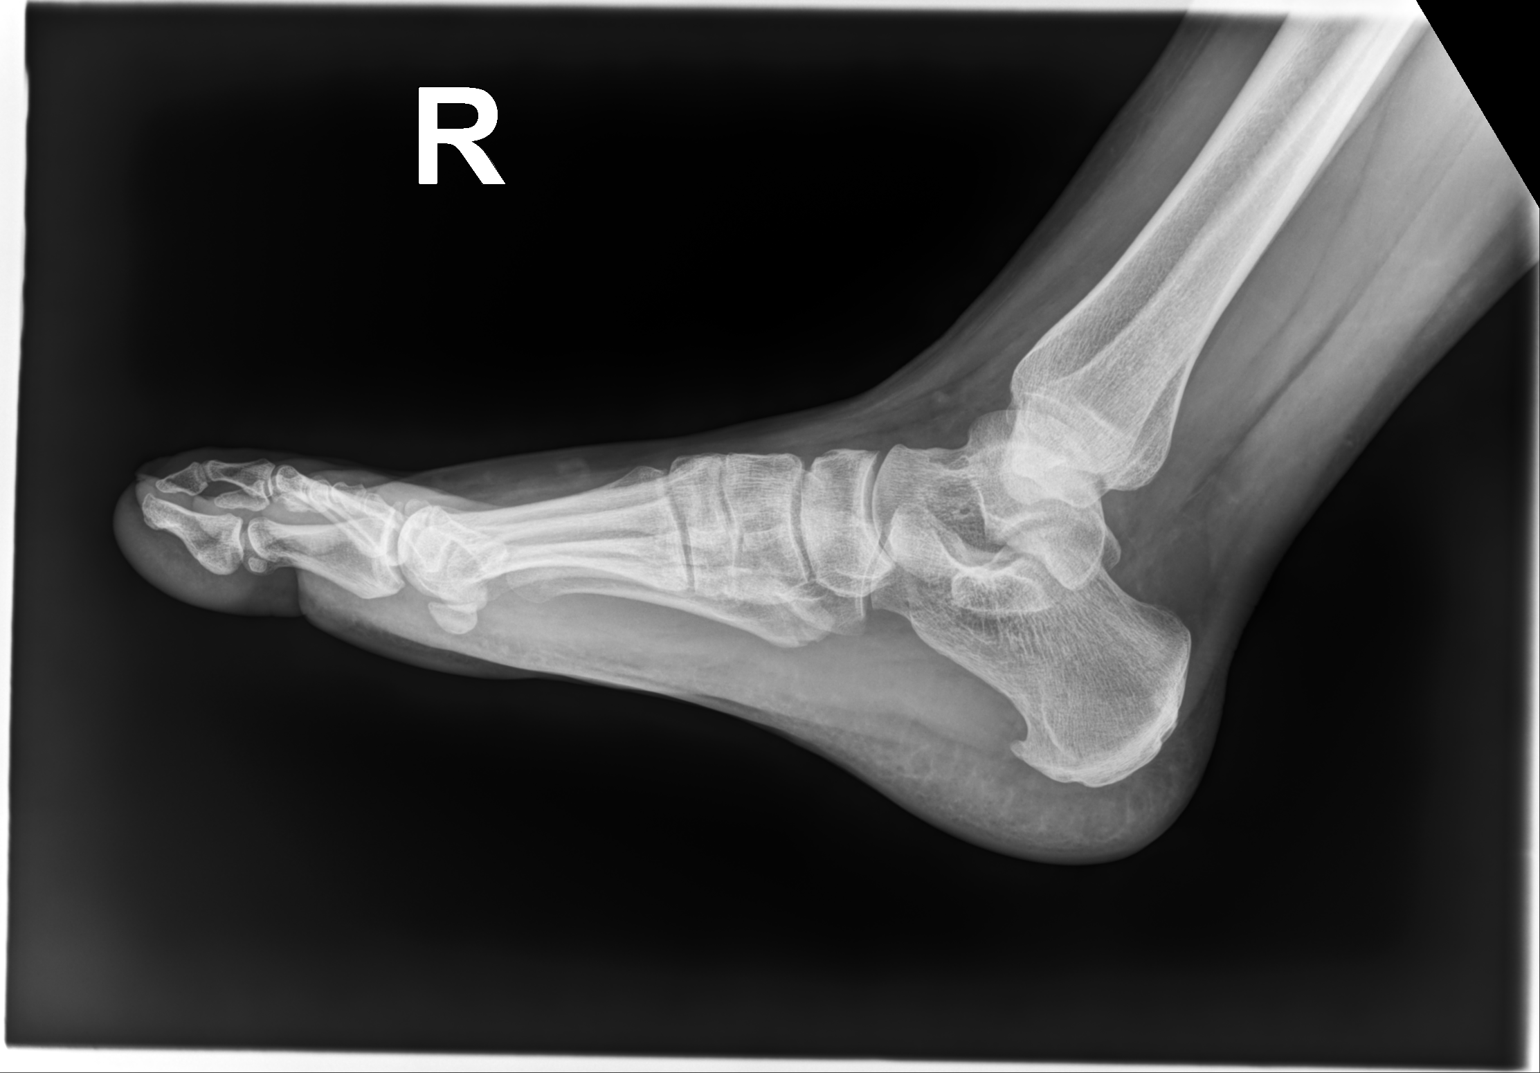

[3 of 3 positions shown; findings below may reference images not displayed]

FINDINGS: There is a nondisplaced base of fifth metatarsal avulsion fracture.
Mild lateral soft tissue swelling. Plantar calcaneal spur.
IMPRESSION: Nondisplaced base of fifth metatarsal avulsion fracture.

## 2023-02-20 ENCOUNTER — Ambulatory Visit (HOSPITAL_COMMUNITY)
Admission: EM | Admit: 2023-02-20 | Discharge: 2023-02-20 | Disposition: A | Discharge status: Home / Self Care | Payer: 59 | Attending: Physician Assistant | Admitting: Physician Assistant

## 2023-02-20 ENCOUNTER — Encounter (HOSPITAL_COMMUNITY): Payer: Self-pay

## 2023-02-20 DIAGNOSIS — I10 Essential (primary) hypertension: Secondary | ICD-10-CM

## 2023-02-20 DIAGNOSIS — R42 Dizziness and giddiness: Secondary | ICD-10-CM | POA: Insufficient documentation

## 2023-02-20 LAB — CBC WITH DIFFERENTIAL/PLATELET
Abs Immature Granulocytes: 0.03 10*3/uL (ref 0.00–0.07)
Basophils Absolute: 0.1 10*3/uL (ref 0.0–0.1)
Basophils Relative: 1 %
Eosinophils Absolute: 0.1 10*3/uL (ref 0.0–0.5)
Eosinophils Relative: 2 %
HCT: 41.4 % (ref 36.0–46.0)
Hemoglobin: 13.8 g/dL (ref 12.0–15.0)
Immature Granulocytes: 0 %
Lymphocytes Relative: 35 %
Lymphs Abs: 2.6 10*3/uL (ref 0.7–4.0)
MCH: 29.1 pg (ref 26.0–34.0)
MCHC: 33.3 g/dL (ref 30.0–36.0)
MCV: 87.3 fL (ref 80.0–100.0)
Monocytes Absolute: 0.6 10*3/uL (ref 0.1–1.0)
Monocytes Relative: 8 %
Neutro Abs: 4 10*3/uL (ref 1.7–7.7)
Neutrophils Relative %: 54 %
Platelets: 346 10*3/uL (ref 150–400)
RBC: 4.74 MIL/uL (ref 3.87–5.11)
RDW: 13.2 % (ref 11.5–15.5)
WBC: 7.3 10*3/uL (ref 4.0–10.5)
nRBC: 0 % (ref 0.0–0.2)

## 2023-02-20 LAB — COMPREHENSIVE METABOLIC PANEL
ALT: 19 U/L (ref 0–44)
AST: 22 U/L (ref 15–41)
Albumin: 3.7 g/dL (ref 3.5–5.0)
Alkaline Phosphatase: 44 U/L (ref 38–126)
Anion gap: 5 (ref 5–15)
BUN: 7 mg/dL (ref 6–20)
CO2: 32 mmol/L (ref 22–32)
Calcium: 8.8 mg/dL — ABNORMAL LOW (ref 8.9–10.3)
Chloride: 101 mmol/L (ref 98–111)
Creatinine, Ser: 0.8 mg/dL (ref 0.44–1.00)
GFR, Estimated: >60 mL/min (ref >60)
Glucose, Bld: 78 mg/dL (ref 70–99)
Potassium: 3.3 mmol/L — ABNORMAL LOW (ref 3.5–5.1)
Sodium: 138 mmol/L (ref 135–145)
Total Bilirubin: 0.6 mg/dL (ref 0.3–1.2)
Total Protein: 6.9 g/dL (ref 6.5–8.1)

## 2023-02-20 LAB — POCT URINALYSIS DIPSTICK, ED / UC
Bilirubin Urine: NEGATIVE
Glucose, UA: NEGATIVE mg/dL
Hgb urine dipstick: NEGATIVE
Leukocytes,Ua: NEGATIVE
Nitrite: NEGATIVE
Protein, ur: 30 mg/dL — AB
Specific Gravity, Urine: >=1.03 (ref 1.005–1.030)
Urobilinogen, UA: 1 mg/dL (ref 0.0–1.0)
pH: 6 (ref 5.0–8.0)

## 2023-02-20 LAB — CBG MONITORING, ED: Glucose-Capillary: 95 mg/dL (ref 70–99)

## 2023-02-20 LAB — POC URINE PREG, ED: Preg Test, Ur: NEGATIVE

## 2023-02-20 MED ORDER — LOSARTAN POTASSIUM 50 MG PO TABS: 50.0000 mg | ORAL_TABLET | Freq: Every day | ORAL | 1 refills | Status: AC

## 2023-02-20 NOTE — ED Provider Notes (Signed)
[facility]    CSN: [id number] Arrival date & time: 02/20/23  1515      History   Chief Complaint Chief Complaint  Patient presents with   Hypertension   Dizziness    HPI Mindy Serrano is a 37 y.o. female.   Patient presents today with several weeks of intermittent symptoms including intermittent headache, visual disturbance, dizziness.  She woke up today and had an extreme episode of lightheadedness and had to lay back down.  She called out of work and then slept for several hours after which she felt better.  She has not had recurrent symptoms since that time but is concerned because her blood pressure has been elevated recently.  She is to monitor this at home and reports this has been as high as 200/120.  She denies formal diagnosis of hypertension has never taken antihypertensive medications.  She does try to limit caffeine and sodium.  She tries to exercise.  She reports intermittent headaches, dizziness, visual disturbance.  She does not currently have a primary care provider.  She denies any recent illness or additional symptoms including cough/congestion.    History reviewed. No pertinent past medical history.  There are no problems to display for this patient.   Past Surgical History:  Procedure Laterality Date   CESAREAN SECTION     CESAREAN SECTION WITH BILATERAL TUBAL LIGATION      OB History   No obstetric history on file.      Home Medications    Prior to Admission medications   Medication Sig Start Date End Date Taking? Authorizing Provider  losartan (COZAAR) 50 MG tablet Take 1 tablet (50 mg total) by mouth daily. Take 1/2 tablet for 1 week and then increase to full tablet thereafter. 02/20/23  Yes Skyelar Swigart, Derry Skill, PA-C    Family History Family History  Problem Relation Age of Onset   Healthy Mother     Social History Social History   Tobacco Use   Smoking status: Former    Packs/day: 0.50    Types: Cigarettes    Quit date:  01/02/2023    Years since quitting: 0.1   Smokeless tobacco: Never  Vaping Use   Vaping Use: Some days   Substances: Nicotine  Substance Use Topics   Alcohol use: Not Currently    Comment: occasionally   Drug use: Never     Allergies   Mustard seed   Review of Systems Review of Systems  Constitutional:  Positive for activity change. Negative for appetite change, fatigue and fever.  Eyes:  Negative for visual disturbance (resolved).  Respiratory:  Negative for cough and shortness of breath (intermittent).   Cardiovascular:  Negative for chest pain.  Gastrointestinal:  Negative for abdominal pain, diarrhea, nausea and vomiting.  Neurological:  Positive for light-headedness. Negative for dizziness, facial asymmetry, weakness, numbness and headaches (intermittent).     Physical Exam Triage Vital Signs ED Triage Vitals  Enc Vitals Group     BP 02/20/23 1755 (!) 154/105     Pulse Rate 02/20/23 1755 75     Resp 02/20/23 1755 16     Temp 02/20/23 1755 98.6 F (37 C)     Temp Source 02/20/23 1755 Oral     SpO2 02/20/23 1755 97 %     Weight 02/20/23 1755 180 lb (81.6 kg)     Height 02/20/23 1755 '5\' 5"'$  (1.651 m)     Head Circumference --      Peak Flow --  Pain Score 02/20/23 1752 0     Pain Loc --      Pain Edu? --      Excl. in [place]? --    Orthostatic VS for the past 24 hrs:  BP- Lying Pulse- Lying BP- Sitting Pulse- Sitting BP- Standing at 0 minutes Pulse- Standing at 0 minutes  02/20/23 1816 (!) 153/100 80 (!) 151/102 83 (!) 160/106 88    Updated Vital Signs BP (!) 154/105 (BP Location: Left Arm)   Pulse 75   Temp 98.6 F (37 C) (Oral)   Resp 16   Ht '5\' 5"'$  (1.651 m)   Wt 180 lb (81.6 kg)   LMP 01/20/2023 (Approximate) Comment: Tubal ligation  SpO2 97%   BMI 29.95 kg/m   Visual Acuity Right Eye Distance:   Left Eye Distance:   Bilateral Distance:    Right Eye Near:   Left Eye Near:    Bilateral Near:     Physical Exam Vitals reviewed.   Constitutional:      General: She is awake. She is not in acute distress.    Appearance: Normal appearance. She is well-developed. She is not ill-appearing.     Comments: Very pleasant female appears stated age in no acute distress sitting comfortably in exam room  HENT:     Head: Normocephalic and atraumatic. No raccoon eyes, Battle's sign or contusion.     Right Ear: Tympanic membrane, ear canal and external ear normal. No hemotympanum.     Left Ear: Tympanic membrane, ear canal and external ear normal. No hemotympanum.     Mouth/Throat:     Tongue: Tongue does not deviate from midline.     Pharynx: Uvula midline. No oropharyngeal exudate or posterior oropharyngeal erythema.  Eyes:     Extraocular Movements: Extraocular movements intact.     Conjunctiva/sclera: Conjunctivae normal.     Pupils: Pupils are equal, round, and reactive to light.  Cardiovascular:     Rate and Rhythm: Normal rate and regular rhythm.     Heart sounds: Normal heart sounds, S1 normal and S2 normal. No murmur heard. Pulmonary:     Effort: Pulmonary effort is normal.     Breath sounds: Normal breath sounds. No wheezing, rhonchi or rales.     Comments: Clear to auscultation bilaterally Musculoskeletal:     Cervical back: Normal range of motion and neck supple. No spinous process tenderness or muscular tenderness.     Right lower leg: No edema.     Left lower leg: No edema.     Comments: Strength 5/5 bilateral upper and lower extremities  Neurological:     General: No focal deficit present.     Mental Status: She is alert and oriented to person, place, and time.     Cranial Nerves: Cranial nerves 2-12 are intact.     Motor: Motor function is intact.     Coordination: Coordination is intact.     Gait: Gait is intact.     Comments: Cranial nerves II through XII grossly intact.  No logical defect on exam  Psychiatric:        Behavior: Behavior is cooperative.      UC Treatments / Results  Labs (all labs  ordered are listed, but only abnormal results are displayed) Labs Reviewed  POCT URINALYSIS DIPSTICK, ED / UC - Abnormal; Notable for the following components:      Result Value   Ketones, ur TRACE (*)    Protein, ur 30 (*)  All other components within normal limits  CBC WITH DIFFERENTIAL/PLATELET  COMPREHENSIVE METABOLIC PANEL  POC URINE PREG, ED  CBG MONITORING, ED    EKG   Radiology No results found.  Procedures Procedures (including critical care time)  Medications Ordered in UC Medications - No data to display  Initial Impression / Assessment and Plan / UC Course  I have reviewed the triage vital signs and the nursing notes.  Pertinent labs & imaging results that were available during my care of the patient were reviewed by me and considered in my medical decision making (see chart for details).     Patient is well-appearing, afebrile, nontoxic, nontachycardic.  She is hypertensive.  EKG was obtained given intermittent shortness of breath and lightheadedness which showed normal sinus rhythm with ventricular rate of 79 bpm without ischemic changes; no previous to compare.  Urine pregnancy was negative.  UA was concentrated and we encouraged her to drink plenty of fluid.  Random glucose was appropriate.  CBC and CMP were obtained today and are pending.  We will contact her if we need to make any adjustments to her treatment plan based on these findings.  Orthostatic vital signs were appropriate.  Discussed that her symptoms could be related to hypertension and encouraged her to start losartan.  She was prescribed 50 mg with instruction to take 25 mg for the first week and monitor her blood pressure regularly.  If this remains above 130/85 she is to increase to 50 mg thereafter.  She does not currently have a primary care so we will try to establish her with someone via PCP assistance.  If she is unable to see them within 2 to 3 weeks she can return here for reevaluation including  medication adjustment.  Recommended that she monitor her diet for salt and avoid NSAIDs.  Discussed that if she has any worsening or changing symptoms including chest pain, recurrent lightheadedness, near syncope/syncope, weakness, visual disturbance, headache she needs to be seen immediately.  Strict return precautions given.  Work excuse note provided.   Final Clinical Impressions(s) / UC Diagnoses   Final diagnoses:  Episodic lightheadedness  Elevated blood pressure reading with diagnosis of hypertension     Discharge Instructions      Your EKG was normal.  I will contact you if your blood work is abnormal.  Please start losartan half tablet for 1 week and then increase to a full tablet thereafter.  Monitor your blood pressure every day and keep log for evaluation at follow-up appointment.  Someone should call you to schedule an appointment with primary care.  If you are unable to see them within 2 to 4 weeks please return here for reevaluation and medication adjustment.  It is important that you drink plenty of fluid.  Avoid salt in your diet.  If you develop any chest pain, shortness of breath, headache, vision change, dizziness in the setting of high blood pressure you need to go to the emergency room.     ED Prescriptions     Medication Sig Dispense Auth. Provider   losartan (COZAAR) 50 MG tablet Take 1 tablet (50 mg total) by mouth daily. Take 1/2 tablet for 1 week and then increase to full tablet thereafter. 30 tablet Danialle Dement, Derry Skill, PA-C      PDMP not reviewed this encounter.   Terrilee Croak, PA-C 02/20/23 1836

## 2023-02-20 NOTE — ED Triage Notes (Signed)
Chief Complaint: Dizziness and elevated BP. When going to stand Patient states there was some vision changes, dizzy. BP has been 164/100. Has stopped smoking but has not helped her BP. Patient is SOB as well.   Onset: few weeks but this morning was worse.   Prescriptions or OTC medications tried: No    Sick exposure: No  New foods, medications, or products: No  Recent Travel: No

## 2023-02-20 NOTE — Discharge Instructions (Addendum)
Your EKG was normal.  I will contact you if your blood work is abnormal.  Please start losartan half tablet for 1 week and then increase to a full tablet thereafter.  Monitor your blood pressure every day and keep log for evaluation at follow-up appointment.  Someone should call you to schedule an appointment with primary care.  If you are unable to see them within 2 to 4 weeks please return here for reevaluation and medication adjustment.  It is important that you drink plenty of fluid.  Avoid salt in your diet.  If you develop any chest pain, shortness of breath, headache, vision change, dizziness in the setting of high blood pressure you need to go to the emergency room.

## 2023-02-27 ENCOUNTER — Encounter (HOSPITAL_COMMUNITY): Payer: Self-pay

## 2023-11-24 DIAGNOSIS — Z419 Encounter for procedure for purposes other than remedying health state, unspecified: Secondary | ICD-10-CM | POA: Diagnosis not present

## 2023-12-25 DIAGNOSIS — Z419 Encounter for procedure for purposes other than remedying health state, unspecified: Secondary | ICD-10-CM | POA: Diagnosis not present

## 2024-01-25 DIAGNOSIS — Z419 Encounter for procedure for purposes other than remedying health state, unspecified: Secondary | ICD-10-CM | POA: Diagnosis not present

## 2024-02-22 DIAGNOSIS — Z419 Encounter for procedure for purposes other than remedying health state, unspecified: Secondary | ICD-10-CM | POA: Diagnosis not present

## 2024-04-04 DIAGNOSIS — Z419 Encounter for procedure for purposes other than remedying health state, unspecified: Secondary | ICD-10-CM | POA: Diagnosis not present

## 2024-05-04 DIAGNOSIS — Z419 Encounter for procedure for purposes other than remedying health state, unspecified: Secondary | ICD-10-CM | POA: Diagnosis not present

## 2024-06-04 DIAGNOSIS — Z419 Encounter for procedure for purposes other than remedying health state, unspecified: Secondary | ICD-10-CM | POA: Diagnosis not present

## 2024-07-04 DIAGNOSIS — Z419 Encounter for procedure for purposes other than remedying health state, unspecified: Secondary | ICD-10-CM | POA: Diagnosis not present

## 2024-08-04 DIAGNOSIS — Z419 Encounter for procedure for purposes other than remedying health state, unspecified: Secondary | ICD-10-CM | POA: Diagnosis not present

## 2024-09-04 DIAGNOSIS — Z419 Encounter for procedure for purposes other than remedying health state, unspecified: Secondary | ICD-10-CM | POA: Diagnosis not present
